# Patient Record
Sex: Male | Born: 1980 | Race: White | Hispanic: No | Marital: Single | Smoking: Current every day smoker
Health system: Southern US, Community
[De-identification: ages and names within clinical notes are randomized; demographics above are authoritative.]

## PROBLEM LIST (undated history)

## (undated) DIAGNOSIS — N289 Disorder of kidney and ureter, unspecified: Secondary | ICD-10-CM

## (undated) HISTORY — PX: ORIF ANKLE FRACTURE: SUR919

## (undated) HISTORY — PX: TOE AMPUTATION: SHX809

## (undated) HISTORY — PX: RENAL ARTERY STENT: SHX2321

## (undated) HISTORY — PX: APPENDECTOMY: SHX54

---

## 2014-08-06 ENCOUNTER — Emergency Department (HOSPITAL_COMMUNITY): Payer: Self-pay

## 2014-08-06 ENCOUNTER — Emergency Department (HOSPITAL_COMMUNITY)
Admission: EM | Admit: 2014-08-06 | Discharge: 2014-08-07 | Disposition: A | Discharge status: Home / Self Care | Payer: Self-pay | Attending: Emergency Medicine | Admitting: Emergency Medicine

## 2014-08-06 ENCOUNTER — Encounter (HOSPITAL_COMMUNITY): Payer: Self-pay | Admitting: Emergency Medicine

## 2014-08-06 ENCOUNTER — Emergency Department (HOSPITAL_COMMUNITY)
Admission: EM | Admit: 2014-08-06 | Discharge: 2014-08-06 | Disposition: A | Discharge status: Home / Self Care | Payer: Self-pay | Attending: Emergency Medicine | Admitting: Emergency Medicine

## 2014-08-06 DIAGNOSIS — R197 Diarrhea, unspecified: Secondary | ICD-10-CM | POA: Insufficient documentation

## 2014-08-06 DIAGNOSIS — Z720 Tobacco use: Secondary | ICD-10-CM | POA: Insufficient documentation

## 2014-08-06 DIAGNOSIS — Z960 Presence of urogenital implants: Secondary | ICD-10-CM | POA: Insufficient documentation

## 2014-08-06 DIAGNOSIS — Z87442 Personal history of urinary calculi: Secondary | ICD-10-CM | POA: Insufficient documentation

## 2014-08-06 DIAGNOSIS — R1110 Vomiting, unspecified: Secondary | ICD-10-CM | POA: Insufficient documentation

## 2014-08-06 DIAGNOSIS — R109 Unspecified abdominal pain: Secondary | ICD-10-CM

## 2014-08-06 DIAGNOSIS — N200 Calculus of kidney: Secondary | ICD-10-CM | POA: Insufficient documentation

## 2014-08-06 DIAGNOSIS — Z9889 Other specified postprocedural states: Secondary | ICD-10-CM | POA: Insufficient documentation

## 2014-08-06 DIAGNOSIS — N1330 Unspecified hydronephrosis: Secondary | ICD-10-CM | POA: Insufficient documentation

## 2014-08-06 HISTORY — DX: Disorder of kidney and ureter, unspecified: N28.9

## 2014-08-06 LAB — URINALYSIS, ROUTINE W REFLEX MICROSCOPIC
Glucose, UA: NEGATIVE mg/dL
Ketones, ur: >80 mg/dL — AB
NITRITE: NEGATIVE
PROTEIN: 100 mg/dL — AB
Specific Gravity, Urine: 1.026 (ref 1.005–1.030)
UROBILINOGEN UA: 1 mg/dL (ref 0.0–1.0)
pH: 5.5 (ref 5.0–8.0)

## 2014-08-06 LAB — COMPREHENSIVE METABOLIC PANEL
ALBUMIN: 4.7 g/dL (ref 3.5–5.2)
ALK PHOS: 72 U/L (ref 39–117)
ALT: 41 U/L (ref 0–53)
AST: 38 U/L — ABNORMAL HIGH (ref 0–37)
Anion gap: 15 (ref 5–15)
BUN: 12 mg/dL (ref 6–23)
CHLORIDE: 103 mmol/L (ref 96–112)
CO2: 21 mmol/L (ref 19–32)
Calcium: 9.8 mg/dL (ref 8.4–10.5)
Creatinine, Ser: 0.98 mg/dL (ref 0.50–1.35)
GFR calc Af Amer: >90 mL/min (ref >90)
GFR calc non Af Amer: >90 mL/min (ref >90)
Glucose, Bld: 101 mg/dL — ABNORMAL HIGH (ref 70–99)
POTASSIUM: 3.4 mmol/L — AB (ref 3.5–5.1)
Sodium: 139 mmol/L (ref 135–145)
Total Bilirubin: 1 mg/dL (ref 0.3–1.2)
Total Protein: 8.1 g/dL (ref 6.0–8.3)

## 2014-08-06 LAB — CBC WITH DIFFERENTIAL/PLATELET
BASOS ABS: 0 10*3/uL (ref 0.0–0.1)
Basophils Relative: 0 % (ref 0–1)
Eosinophils Absolute: 0.1 10*3/uL (ref 0.0–0.7)
Eosinophils Relative: 1 % (ref 0–5)
HCT: 47.7 % (ref 39.0–52.0)
Hemoglobin: 16.8 g/dL (ref 13.0–17.0)
Lymphocytes Relative: 14 % (ref 12–46)
Lymphs Abs: 1.9 10*3/uL (ref 0.7–4.0)
MCH: 31.9 pg (ref 26.0–34.0)
MCHC: 35.2 g/dL (ref 30.0–36.0)
MCV: 90.5 fL (ref 78.0–100.0)
Monocytes Absolute: 1.1 10*3/uL — ABNORMAL HIGH (ref 0.1–1.0)
Monocytes Relative: 8 % (ref 3–12)
NEUTROS PCT: 77 % (ref 43–77)
Neutro Abs: 11.2 10*3/uL — ABNORMAL HIGH (ref 1.7–7.7)
PLATELETS: 385 10*3/uL (ref 150–400)
RBC: 5.27 MIL/uL (ref 4.22–5.81)
RDW: 12.8 % (ref 11.5–15.5)
WBC: 14.4 10*3/uL — ABNORMAL HIGH (ref 4.0–10.5)

## 2014-08-06 LAB — URINE MICROSCOPIC-ADD ON

## 2014-08-06 LAB — LIPASE, BLOOD: LIPASE: 18 U/L (ref 11–59)

## 2014-08-06 MED ORDER — ONDANSETRON HCL 4 MG/2ML IJ SOLN
4.0000 mg | Freq: Once | INTRAMUSCULAR | Status: AC
  Administered 2014-08-06: 4 mg via INTRAVENOUS
  Filled 2014-08-06: qty 2

## 2014-08-06 MED ORDER — KETOROLAC TROMETHAMINE 30 MG/ML IJ SOLN
30.0000 mg | Freq: Once | INTRAMUSCULAR | Status: AC
  Administered 2014-08-07: 30 mg via INTRAVENOUS
  Filled 2014-08-06: qty 1

## 2014-08-06 MED ORDER — MORPHINE SULFATE 4 MG/ML IJ SOLN
6.0000 mg | Freq: Once | INTRAMUSCULAR | Status: AC
  Administered 2014-08-07: 6 mg via INTRAVENOUS
  Filled 2014-08-06: qty 2

## 2014-08-06 MED ORDER — KETOROLAC TROMETHAMINE 30 MG/ML IJ SOLN
30.0000 mg | Freq: Once | INTRAMUSCULAR | Status: AC
  Administered 2014-08-06: 30 mg via INTRAVENOUS
  Filled 2014-08-06: qty 1

## 2014-08-06 MED ORDER — OXYCODONE-ACETAMINOPHEN 5-325 MG PO TABS: 1.0000 | ORAL_TABLET | ORAL | Status: AC | PRN

## 2014-08-06 MED ORDER — MORPHINE SULFATE 4 MG/ML IJ SOLN
4.0000 mg | Freq: Once | INTRAMUSCULAR | Status: AC
  Administered 2014-08-06: 4 mg via INTRAVENOUS
  Filled 2014-08-06: qty 1

## 2014-08-06 MED ORDER — HYDROMORPHONE HCL 1 MG/ML IJ SOLN
1.0000 mg | Freq: Once | INTRAMUSCULAR | Status: AC
  Administered 2014-08-06: 1 mg via INTRAVENOUS
  Filled 2014-08-06: qty 1

## 2014-08-06 MED ORDER — SODIUM CHLORIDE 0.9 % IV BOLUS (SEPSIS)
1000.0000 mL | Freq: Once | INTRAVENOUS | Status: AC
  Administered 2014-08-06: 1000 mL via INTRAVENOUS

## 2014-08-06 MED ORDER — SODIUM CHLORIDE 0.9 % IV BOLUS (SEPSIS)
1000.0000 mL | Freq: Once | INTRAVENOUS | Status: AC
  Administered 2014-08-07: 1000 mL via INTRAVENOUS

## 2014-08-06 MED ORDER — PROMETHAZINE HCL 25 MG PO TABS: 25.0000 mg | ORAL_TABLET | Freq: Four times a day (QID) | ORAL | Status: AC | PRN

## 2014-08-06 NOTE — ED Notes (Signed)
Patient states he was here this morning, CT showed a kidney stone. Patient was having a difficult time voiding then, states he still has not voided in 10 hours. C/o Pressure.

## 2014-08-06 NOTE — ED Notes (Signed)
Pt is attempting to give urine sample at this time 

## 2014-08-06 NOTE — ED Notes (Signed)
Pt reports L flank pain with emesis that started around 7 am this morning. Pt sts that he last urinated around and has been unable to since. Pt has hx of kidney stones with bilateral stents.  Pt is A&O and in NAD.

## 2014-08-06 NOTE — ED Provider Notes (Signed)
TIME SEEN: 9:12 AM  CHIEF COMPLAINT: Left flank pain, vomiting, diarrhea  HPI: Pt is a 34 y.o. male with reported history of kidney stones that was diagnosed in [place], [place] requiring left-sided ureteral stent placed one year ago who presents emergency room with left flank pain that started at 7:00 this morning with vomiting and diarrhea. He is unsure if this feels similar to his prior kidney stones. Denies dysuria, hematuria, penile discharge, testicular pain or swelling. No fever. No sick contacts. States he works at [employer] but denies any injury to his back. No numbness, tingling or focal weakness. No aggravating or relieving factors. Pain does not worsen with movement or palpation. No radiation of pain. Described as severe, sharp.  ROS: See HPI Constitutional: no fever  Eyes: no drainage  ENT: no runny nose   Cardiovascular:  no chest pain  Resp: no SOB  GI: no vomiting GU: no dysuria Integumentary: no rash  Allergy: no hives  Musculoskeletal: no leg swelling  Neurological: no slurred speech ROS otherwise negative  PAST MEDICAL HISTORY/PAST SURGICAL HISTORY:  Past Medical History  Diagnosis Date  . Renal disorder     kidney stones    MEDICATIONS:  Prior to Admission medications   Not on File    ALLERGIES:  No Known Allergies  SOCIAL HISTORY:  History  Substance Use Topics  . Smoking status: Current Every Day Smoker -- 0.50 packs/day    Types: Cigarettes  . Smokeless tobacco: Not on file  . Alcohol Use: Yes     Comment: socially    FAMILY HISTORY: No family history on file.  EXAM: BP 112/69 mmHg  Pulse 105  Temp(Src) 98.1 F (36.7 C) (Oral)  Resp 18  SpO2 99% CONSTITUTIONAL: Alert and oriented and responds appropriately to questions. Well-appearing; well-nourished, appears uncomfortable but nontoxic HEAD: Normocephalic EYES: Conjunctivae clear, PERRL ENT: normal nose; no rhinorrhea; moist mucous membranes; pharynx without lesions noted, extremely poor  dentition without obvious sign of abscess NECK: Supple, no meningismus, no LAD  CARD: regular and tachycardic; S1 and S2 appreciated; no murmurs, no clicks, no rubs, no gallops RESP: Normal chest excursion without splinting or tachypnea; breath sounds clear and equal bilaterally; no wheezes, no rhonchi, no rales,  ABD/GI: Normal bowel sounds; non-distended; soft, non-tender, no rebound, no guarding BACK:  The back appears normal and is non-tender to palpation, there is no CVA tenderness, no midline spinal tenderness or step-off or deformity EXT: Normal ROM in all joints; non-tender to palpation; no edema; normal capillary refill; no cyanosis    SKIN: Normal color for age and race; warm NEURO: Moves all extremities equally, sensation to light touch intact diffusely, normal gait PSYCH: The patient's mood and manner are appropriate. Grooming and personal hygiene are appropriate.  MEDICAL DECISION MAKING: Patient here with sudden onset left flank pain, vomiting or diarrhea. Suspect probable kidney stone. Patient reports he has had to have bilateral ureteral stents in the past. We'll obtain labs, urine, CT abdomen and pelvis. We'll give IV fluids, Toradol, morphine and Zofran.  ED PROGRESS: Patient reports feeling better after pain medication. Labs show mild leukocytosis with left shift. Urine shows hemoglobin, leukocytes, ketones, bacteria and squamous cells. We'll continue IV hydration. CT scan shows moderate left-sided hydronephrosis with an indwelling left-sided ureteral stent with good positioning but hydronephrosis may reflect relative stent malfunction/occlusion. There is a calculus in the lower pole of the left kidney. He states he does not plan on going back to [place] and now lives here in  [place] and does not have a urologist for follow-up. He thought that he had already had the stent removed. States that he was told that he should have passed the stent. Will consult urology on call.   12:00 PM   D/w Dr. Vernie Ammonsttelin, urology on call.  He recommends sending a urine culture. We both agree the patient does not need to be started on abx at this time. Patient is feeling much better. We'll discharge on pain medication and with nausea medicine and have him follow-up with urology. He will need the stent removed non-emergently. Discussed return precautions. Patient verbalized understanding and is comfortable with plan.  Layla MawKristen N Ward, DO 08/06/14 1243

## 2014-08-06 NOTE — Discharge Instructions (Signed)
You have a stent in your left ureter which is the tube that connects the kidney to the bladder but is still in place and needs to be removed and is likely the cause of your left-sided back pain. This does not need to be removed emergently. We recommended she follow-up with the urologist. Please call to schedule an appointment. If you develop fevers over 101, vomiting and cannot stop, uncontrolled pain, please return to the emergency department.   Hydronephrosis Hydronephrosis is an abnormal enlargement of your kidney. It can affect one or both the kidneys. It results from the backward pressure of urine on the kidneys, when the flow of urine is blocked. Normally, the urine drains from the kidney through the urine tube (ureter), into a sac which holds the urine until urination (bladder). When the urinary flow is blocked, the urine collects above the block. This causes an increase in the pressure inside the kidney, which in turn leads to its enlargement. The block can occur at the point where the kidney joins the ureter. Treatment depends on the cause and location of the block.  CAUSES  The causes of this condition include:  Birth defect of the kidney or ureter.  Kink at the point where the kidney joins the ureter.  Stones and blood clots in the kidney or ureter.  Cancer, injury, or infection of the ureter.  Scar tissue formation.  Backflow of urine (reflux).  Cancer of bladder or prostate gland.  Abnormality of the nerves or muscles of the kidney or ureter.  Lower part of the ureter protruding into the bladder (ureterocele).  Abnormal contractions of the bladder.  Both the kidneys can be affected during pregnancy. This is because the enlarging uterus presses on the ureters and blocks the flow of urine. SYMPTOMS  The symptoms depend on the location of the block. They also depend on how long the block has been present. You may feel pain on the affected side. Sometimes, you may not have any  symptoms. There may be a dull ache or discomfort in the flank. The common symptoms are:  Flank pain.  Swelling of the abdomen.  Pain in the abdomen.  Nausea and vomiting.  Fever.  Pain while passing urine.  Urgency for urination.  Frequent or urgent urination.  Infection of the urinary tract. DIAGNOSIS  Your caregiver will examine you after asking about your symptoms. You may be asked to do blood and urine tests. Your caregiver may order a special X-ray, ultrasound, or CT scan. Sometimes a rigid or flexible telescope (cystoscope) is used to view the site of the blockage.  TREATMENT  Treatment depends on the site, cause, and duration of the block. The goal of treatment is to remove the blockage. Your caregiver will plan the treatment based on your condition. The different types of treatment are:   Putting in a soft plastic tube (ureteral stent) to connect the bladder with the kidney. This will help in draining the urine.  Putting in a soft tube (nephrostomy tube). This is placed through skin into the kidney. The trapped urine is drained out through the back. A plastic bag is attached to your skin to hold the urine that has drained out.  Antibiotics to treat or prevent infection.  Breaking down of the stone (lithotripsy). HOME CARE INSTRUCTIONS   It may take some time for the hydronephrosis to go away (resolve). Drink fluids as directed by your caregiver , and get a lot of rest.  If you have a  drain in, your caregiver will give you directions about how to care for it. Be sure you understand these directions completely before you go home.  Take any antibiotics, pain medications, or other prescriptions exactly as prescribed.  Follow-up with your caregivers as directed. SEEK MEDICAL CARE IF:   You continue to have flank pain, nausea, or difficulty with urination.  You have any problem with any type of drainage device.  Your urine becomes cloudy or bloody. SEEK IMMEDIATE  MEDICAL CARE IF:   You have severe flank and/or abdominal pain.  You develop vomiting and are unable to hold down fluids.  You develop a fever above 100.5 F (38.1 C), or as per your caregiver. MAKE SURE YOU:   Understand these instructions.  Will watch your condition.  Will get help right away if you are not doing well or get worse. Document Released: 03/17/2007 Document Revised: 08/12/2011 Document Reviewed: 05/03/2010 [facility] Patient Information 2015 [employer], [place]. This information is not intended to replace advice given to you by your health care provider. Make sure you discuss any questions you have with your health care provider.

## 2014-08-06 NOTE — ED Notes (Signed)
Pt from home via EMS-Per EMS pt had sudden onset L flank pain with emesis. Pt has hx of kidney stones. Pt denies dysuria but reports dark urine. Pt is A&O and in NAD. Pt ambulates with steady gait

## 2014-08-06 NOTE — ED Notes (Signed)
Patient transported to CT 

## 2014-08-06 NOTE — ED Notes (Signed)
Bed: WA17 Expected date: 08/06/14 Expected time: 9:04 AM Means of arrival: Ambulance Comments: ? Kidney stones L flank pain

## 2014-08-06 NOTE — ED Notes (Signed)
MD at bedside. 

## 2014-08-07 ENCOUNTER — Emergency Department (HOSPITAL_COMMUNITY): Payer: Self-pay

## 2014-08-07 LAB — I-STAT CHEM 8, ED
BUN: 11 mg/dL (ref 6–23)
CALCIUM ION: 1.12 mmol/L (ref 1.12–1.23)
CHLORIDE: 101 mmol/L (ref 96–112)
Creatinine, Ser: 0.9 mg/dL (ref 0.50–1.35)
Glucose, Bld: 81 mg/dL (ref 70–99)
HCT: 48 % (ref 39.0–52.0)
Hemoglobin: 16.3 g/dL (ref 13.0–17.0)
Potassium: 3.5 mmol/L (ref 3.5–5.1)
Sodium: 138 mmol/L (ref 135–145)
TCO2: 20 mmol/L (ref 0–100)

## 2014-08-07 LAB — CBC WITH DIFFERENTIAL/PLATELET
BASOS ABS: 0.1 10*3/uL (ref 0.0–0.1)
Basophils Relative: 0 % (ref 0–1)
Eosinophils Absolute: 0.4 10*3/uL (ref 0.0–0.7)
Eosinophils Relative: 3 % (ref 0–5)
HEMATOCRIT: 43.6 % (ref 39.0–52.0)
Hemoglobin: 15 g/dL (ref 13.0–17.0)
Lymphocytes Relative: 21 % (ref 12–46)
Lymphs Abs: 2.5 10*3/uL (ref 0.7–4.0)
MCH: 31.8 pg (ref 26.0–34.0)
MCHC: 34.4 g/dL (ref 30.0–36.0)
MCV: 92.6 fL (ref 78.0–100.0)
Monocytes Absolute: 1.4 10*3/uL — ABNORMAL HIGH (ref 0.1–1.0)
Monocytes Relative: 12 % (ref 3–12)
NEUTROS PCT: 64 % (ref 43–77)
Neutro Abs: 7.5 10*3/uL (ref 1.7–7.7)
Platelets: 346 10*3/uL (ref 150–400)
RBC: 4.71 MIL/uL (ref 4.22–5.81)
RDW: 13 % (ref 11.5–15.5)
WBC: 11.8 10*3/uL — ABNORMAL HIGH (ref 4.0–10.5)

## 2014-08-07 LAB — URINALYSIS, ROUTINE W REFLEX MICROSCOPIC
GLUCOSE, UA: NEGATIVE mg/dL
Ketones, ur: >80 mg/dL — AB
Nitrite: NEGATIVE
PH: 6 (ref 5.0–8.0)
Protein, ur: 30 mg/dL — AB
SPECIFIC GRAVITY, URINE: 1.024 (ref 1.005–1.030)
Urobilinogen, UA: 1 mg/dL (ref 0.0–1.0)

## 2014-08-07 LAB — URINE CULTURE: Colony Count: 50000

## 2014-08-07 LAB — URINE MICROSCOPIC-ADD ON

## 2014-08-07 MED ORDER — MORPHINE SULFATE 4 MG/ML IJ SOLN: 4.0000 mg | Freq: Once | INTRAMUSCULAR | Status: DC

## 2014-08-07 MED ORDER — SODIUM CHLORIDE 0.9 % IV BOLUS (SEPSIS)
1000.0000 mL | Freq: Once | INTRAVENOUS | Status: AC
  Administered 2014-08-07: 1000 mL via INTRAVENOUS

## 2014-08-07 MED ORDER — CEPHALEXIN 500 MG PO CAPS: 500.0000 mg | ORAL_CAPSULE | Freq: Two times a day (BID) | ORAL | Status: AC

## 2014-08-07 MED ORDER — CEPHALEXIN 500 MG PO CAPS
500.0000 mg | ORAL_CAPSULE | Freq: Once | ORAL | Status: AC
  Administered 2014-08-07: 500 mg via ORAL
  Filled 2014-08-07: qty 1

## 2014-08-07 MED ORDER — MORPHINE SULFATE 4 MG/ML IJ SOLN
6.0000 mg | Freq: Once | INTRAMUSCULAR | Status: AC
Start: 2014-08-07 — End: 2014-08-07
  Administered 2014-08-07: 6 mg via INTRAVENOUS
  Filled 2014-08-07: qty 2

## 2014-08-07 NOTE — Discharge Instructions (Signed)
Kidney Stones Mr. Izola PriceMyers, your urine shows a mild infection, take antibiotics as prescribed. Follow-up with urology within 3 days for continued management. If symptoms worsen come back to the emergency department immediately. Thank you. Kidney stones (urolithiasis) are solid masses that form inside your kidneys. The intense pain is caused by the stone moving through the kidney, ureter, bladder, and urethra (urinary tract). When the stone moves, the ureter starts to spasm around the stone. The stone is usually passed in your pee (urine).  HOME CARE  Drink enough fluids to keep your pee clear or pale yellow. This helps to get the stone out.  Strain all pee through the provided strainer. Do not pee without peeing through the strainer, not even once. If you pee the stone out, catch it in the strainer. The stone may be as small as a grain of salt. Take this to your doctor. This will help your doctor figure out what you can do to try to prevent more kidney stones.  Only take medicine as told by your doctor.  Follow up with your doctor as told.  Get follow-up X-rays as told by your doctor. GET HELP IF: You have pain that gets worse even if you have been taking pain medicine. GET HELP RIGHT AWAY IF:   Your pain does not get better with medicine.  You have a fever or shaking chills.  Your pain increases and gets worse over 18 hours.  You have new belly (abdominal) pain.  You feel faint or pass out.  You are unable to pee. MAKE SURE YOU:   Understand these instructions.  Will watch your condition.  Will get help right away if you are not doing well or get worse. Document Released: 11/06/2007 Document Revised: 01/20/2013 Document Reviewed: 10/21/2012 [facility] Patient Information 2015 [place], [place]. This information is not intended to replace advice given to you by your health care provider. Make sure you discuss any questions you have with your health care provider.

## 2014-08-07 NOTE — ED Notes (Signed)
Pt has been in lobby, possibly since discharge this morning. Pt given bus pass and sandwich, given instructions on how to [facility].

## 2014-08-07 NOTE — ED Notes (Signed)
Pt is unable to urinate at this time. 

## 2014-08-07 NOTE — ED Notes (Signed)
Obtained of dk concentrated urine.

## 2014-08-07 NOTE — ED Notes (Signed)
Pt is still unable to give a urine specimen he has tried but was not successful.

## 2014-08-07 NOTE — ED Provider Notes (Signed)
CSN: [id number]     Arrival date & time 08/06/14  2142 History   First MD Initiated Contact with Patient 08/06/14 2349     Chief Complaint  Patient presents with  . unable to void      (Consider location/radiation/quality/duration/timing/severity/associated sxs/prior Treatment) HPI    Karlyn AgeeRichard Nole is a 34 y.o. male with past medical history of nephrolithiasis and stent placement presenting today with left lower quadrant abdominal pain. Patient was seen earlier today and discharged home with pain medication. CT scan that showed possible kinking of the stent. He was told to come back if he cannot urinate throughout the day. Patient states he has not been able to urinate today at all. His pain has gotten worse throughout the day as well. Patient did not fill his prescription. He denies any fevers currently.  He states he was not even aware that his stent was still in, he thought it had been removed.  10 Systems reviewed and are negative for acute change except as noted in the HPI.   Past Medical History  Diagnosis Date  . Renal disorder     kidney stones   Past Surgical History  Procedure Laterality Date  . Renal artery stent Bilateral   . Toe amputation Right reattached  . Orif ankle fracture Right   . Appendectomy     No family history on file. History  Substance Use Topics  . Smoking status: Current Every Day Smoker -- 0.50 packs/day    Types: Cigarettes  . Smokeless tobacco: Not on file  . Alcohol Use: Yes     Comment: socially    Review of Systems    Allergies  Review of patient's allergies indicates no known allergies.  Home Medications   Prior to Admission medications   Medication Sig Start Date End Date Taking? Authorizing Provider  oxyCODONE-acetaminophen (PERCOCET/ROXICET) 5-325 MG per tablet Take 1 tablet by mouth every 4 (four) hours as needed. 08/06/14   Kristen N Ward, DO  promethazine (PHENERGAN) 25 MG tablet Take 1 tablet (25 mg total) by mouth every 6  (six) hours as needed for nausea or vomiting. 08/06/14   Kristen N Ward, DO   BP 136/85 mmHg  Pulse 90  Temp(Src) 98.2 F (36.8 C) (Oral)  Resp 16  Ht 6\' 2"  (1.88 m)  Wt 210 lb (95.255 kg)  BMI 26.95 kg/m2  SpO2 98% Physical Exam  Constitutional: He is oriented to person, place, and time. Vital signs are normal. He appears well-developed and well-nourished.  Non-toxic appearance. He does not appear ill. No distress.  HENT:  Head: Normocephalic and atraumatic.  Nose: Nose normal.  Mouth/Throat: Oropharynx is clear and moist. No oropharyngeal exudate.  Eyes: Conjunctivae and EOM are normal. Pupils are equal, round, and reactive to light. No scleral icterus.  Neck: Normal range of motion. Neck supple. No tracheal deviation, no edema, no erythema and normal range of motion present. No thyroid mass and no thyromegaly present.  Cardiovascular: Normal rate, regular rhythm, S1 normal, S2 normal, normal heart sounds, intact distal pulses and normal pulses.  Exam reveals no gallop and no friction rub.   No murmur heard. Pulses:      Radial pulses are 2+ on the right side, and 2+ on the left side.       Dorsalis pedis pulses are 2+ on the right side, and 2+ on the left side.  Pulmonary/Chest: Effort normal and breath sounds normal. No respiratory distress. He has no wheezes. He has no rhonchi.  He has no rales.  Abdominal: Soft. Normal appearance and bowel sounds are normal. He exhibits no distension, no ascites and no mass. There is no hepatosplenomegaly. There is no tenderness. There is no rebound, no guarding and no CVA tenderness.  No CVA tenderness.  Musculoskeletal: Normal range of motion. He exhibits no edema or tenderness.  Lymphadenopathy:    He has no cervical adenopathy.  Neurological: He is alert and oriented to person, place, and time. He has normal strength. No cranial nerve deficit or sensory deficit.  Skin: Skin is warm, dry and intact. No petechiae and no rash noted. He is not  diaphoretic. No erythema. No pallor.  Psychiatric: He has a normal mood and affect. His behavior is normal. Judgment normal.  Nursing note and vitals reviewed.   ED Course  Procedures (including critical care time) Labs Review Labs Reviewed  CBC WITH DIFFERENTIAL/PLATELET - Abnormal; Notable for the following:    WBC 11.8 (*)    Monocytes Absolute 1.4 (*)    All other components within normal limits  URINALYSIS, ROUTINE W REFLEX MICROSCOPIC - Abnormal; Notable for the following:    Color, Urine AMBER (*)    APPearance CLOUDY (*)    Hgb urine dipstick LARGE (*)    Bilirubin Urine MODERATE (*)    Ketones, ur >80 (*)    Protein, ur 30 (*)    Leukocytes, UA SMALL (*)    All other components within normal limits  URINE MICROSCOPIC-ADD ON - Abnormal; Notable for the following:    Bacteria, UA FEW (*)    All other components within normal limits  I-STAT CHEM 8, ED    Imaging Review US Renal  08/07/2014   CLINICAL DATA:  Assess for worsening hydronephrosis  EXAM: RENAL/URINARY TRACT ULTRASOUND COMPLETE  COMPARISON:  Abdominal CT from yesterday  FINDINGS: Right Kidney:  Length: 11.6 cm. Echogenicity within normal limits. No mass or hydronephrosis visualized.  Left Kidney:  Length: 12.5 cm. There is unchanged moderate hydronephrosis. An 11 mm (by CT) stone is noted in the lower pole. A portion of the stent is visible at the level of the renal pelvis. No mass or cystic collection.  Bladder:  Unremarkable bladder wall. Ureteral stent from the left with fully formed lower loop.  IMPRESSION: 1. Unchanged moderate left hydronephrosis. 2. No appreciable displacement of a left ureteral stent. 3. Left nephrolithiasis.   Electronically Signed   By: Marnee Spring M.D.   On: 08/07/2014 01:43   Ct Renal Stone Study  08/06/2014   CLINICAL DATA:  Left flank pain and history of left ureteral stent placement and renal calculi.  EXAM: CT ABDOMEN AND PELVIS WITHOUT CONTRAST  TECHNIQUE: Multidetector CT imaging  of the abdomen and pelvis was performed following the standard protocol without IV contrast.  COMPARISON:  None.  FINDINGS: There is moderate left-sided hydronephrosis. A ureteral stent is present extending from the renal pelvis into the bladder. The presence of hydronephrosis may implicate relative stent malfunction/ occlusion. A calculus is present in the anterior lower pole collecting system of the left kidney measuring approximately 7 x 11 x 11 mm. This is not causing obstruction of the lower pole. No other renal or ureteral calculi identified bilaterally. The right kidney and ureter appear normal. No bladder calculi.  The rest of the unenhanced appearance of the abdomen and pelvis is unremarkable including the liver, gallbladder, pancreas, spleen, adrenal glands and bowel. No abnormal fluid collections are visualized. No masses or enlarged lymph nodes are seen. Bowel  shows no evidence of obstruction or ileus. No free air, free fluid or abscess is identified. No evidence of hernia.  Bony structures are unremarkable. Prominent inferior endplate Schmorl's node of the L3 vertebral body. Visualized lung bases are normal.  IMPRESSION: Moderate left-sided hydronephrosis. Indwelling left-sided ureteral stent shows good positioning and the presence of hydronephrosis may reflect relative stent malfunction/ occlusion. Solitary calculus in the lower pole of the left kidney measures approximately 11 mm in greatest diameter.   Electronically Signed   By: Irish Lack M.D.   On: 08/06/2014 10:49     EKG Interpretation None      MDM   Final diagnoses:  None    Patient since emergency department for worsening abdominal pain and inability to urinate, and the setting of recent nephrolithiasis diagnosis. Initial CT scan earlier today shows kinking of his stent. Will obtain bladder scan, repeat creatinine, repeat urinalysis and renal ultrasound. If hydronephrosis is worse, patient warrants admission to urology for  continued management.   Repeat ultrasound reveals stable hydronephrosis. Creatinine is stable as well. Urine is borderline for infection. Since the patient is a bounce back we'll treat with antibiotics. He was given urology follow-up, also has pain prescription from earlier today. At this time the patient's vital signs remain within normal limits, he appears in no acute distress and he is safe for discharge.   Tomasita Crumble, MD 08/07/14 [phone]

## 2014-08-07 NOTE — ED Notes (Signed)
Requested patient to urinate. Urinal was given to patient.

## 2014-08-08 ENCOUNTER — Ambulatory Visit: Payer: Self-pay | Admitting: Urology

## 2016-07-30 IMAGING — CT CT RENAL STONE PROTOCOL
1 series · 15 of 26 positions shown, 19 images · non-contrast
Comparison: None.

CLINICAL DATA: Left flank pain and history of left ureteral stent
placement and renal calculi.

EXAM:
CT ABDOMEN AND PELVIS WITHOUT CONTRAST
TECHNIQUE: Multidetector CT imaging of the abdomen and pelvis was performed
following the standard protocol without IV contrast.

[Series 4: lung · axial · 0.74mm/px · z∈[-180,-65]mm · 15 of 26 slices shown, 19 images]
[im 2/26  soft-tissue]
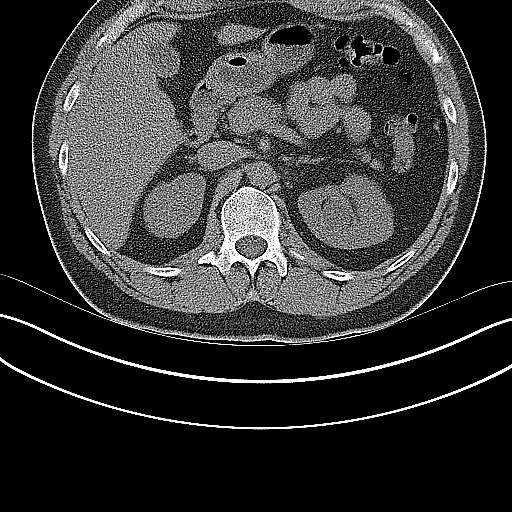
[im 2/26  bone]
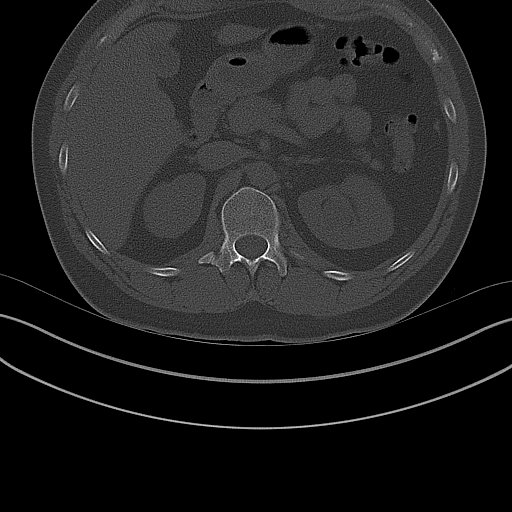
[im 4/26  soft-tissue]
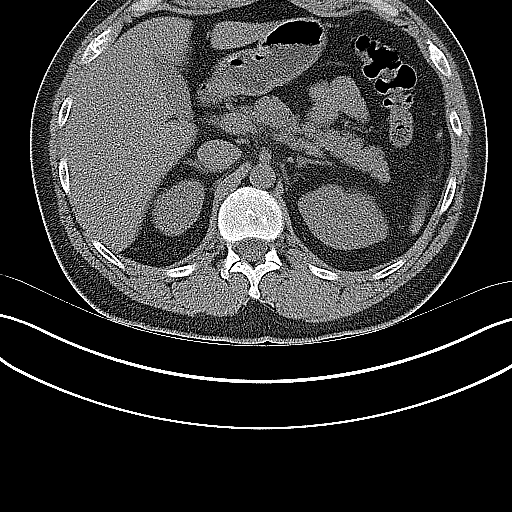
[im 6/26  soft-tissue]
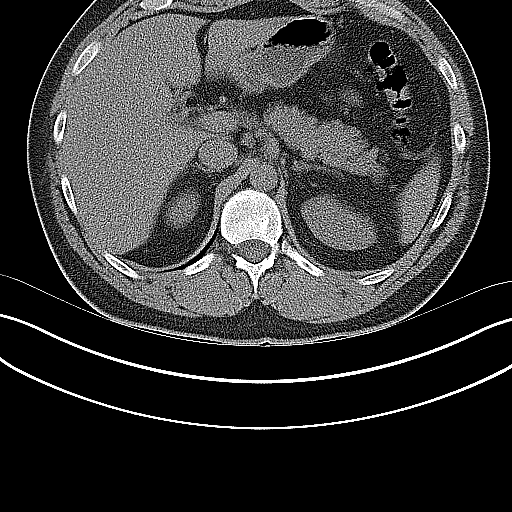
[im 8/26  soft-tissue]
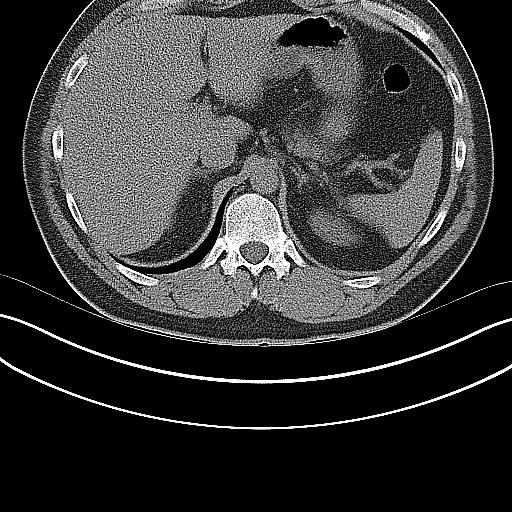
[im 10/26  soft-tissue]
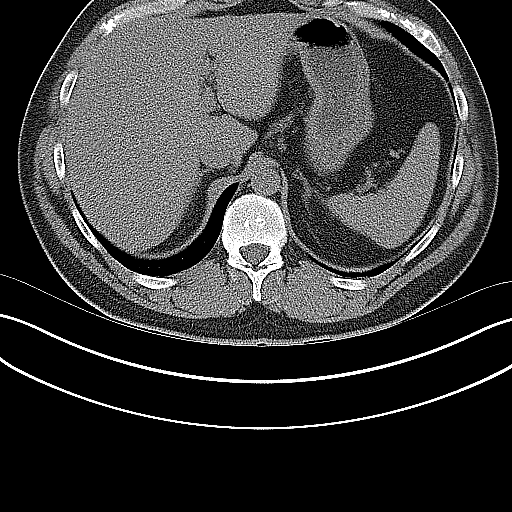
[im 12/26  soft-tissue]
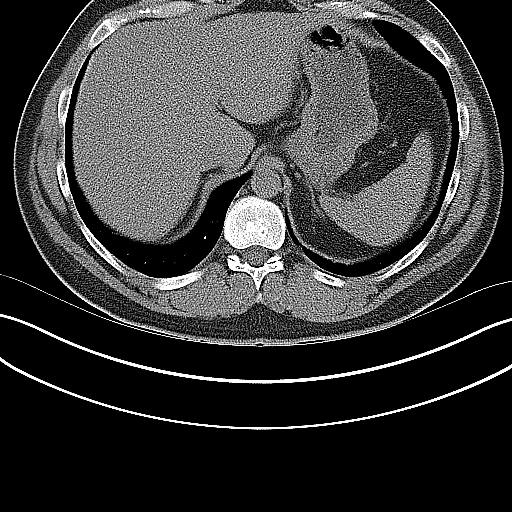
[im 14/26  soft-tissue]
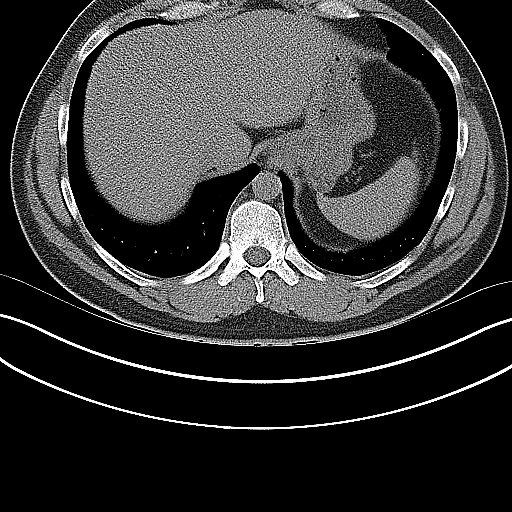
[im 15/26  soft-tissue]
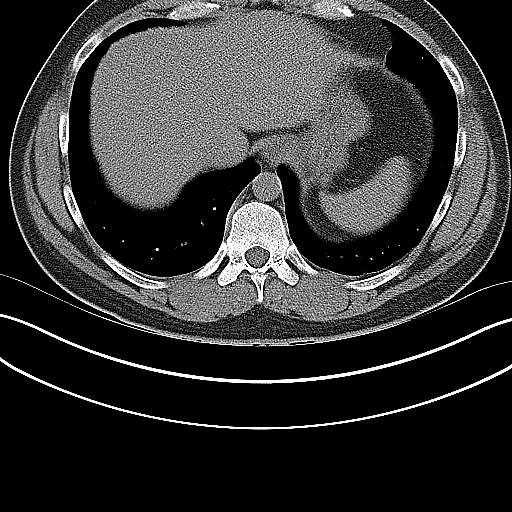
[im 17/26  soft-tissue]
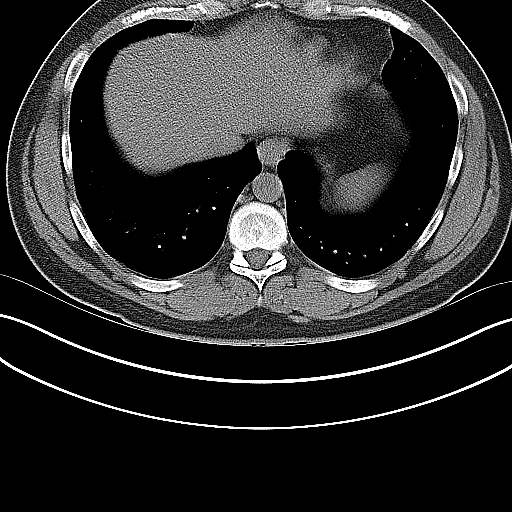
[im 17/26  bone]
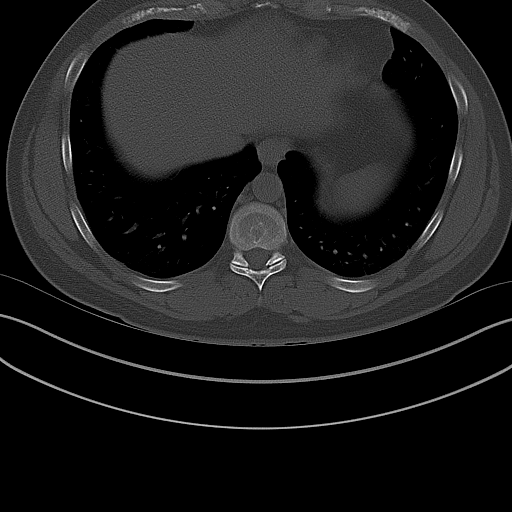
[im 19/26  soft-tissue]
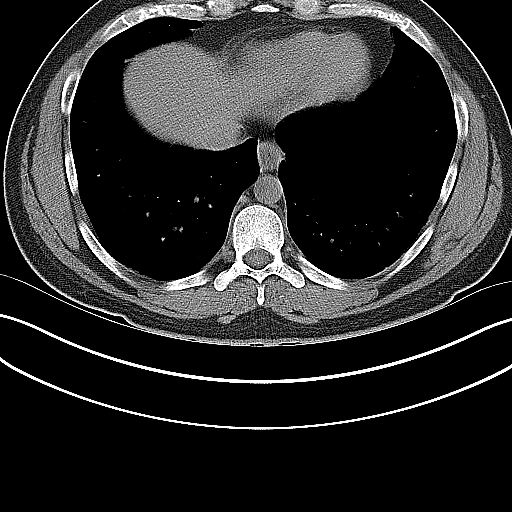
[im 21/26  soft-tissue]
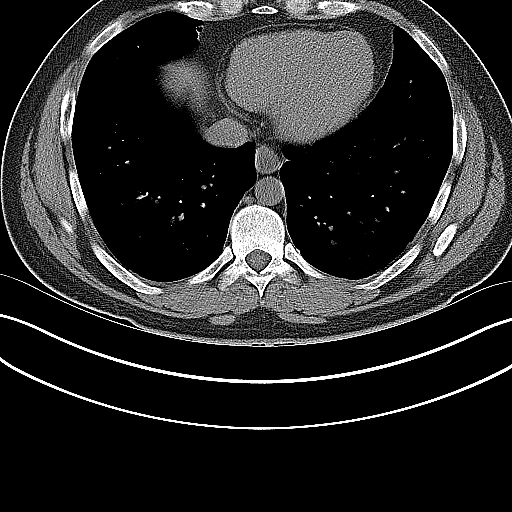
[im 22/26  lung]
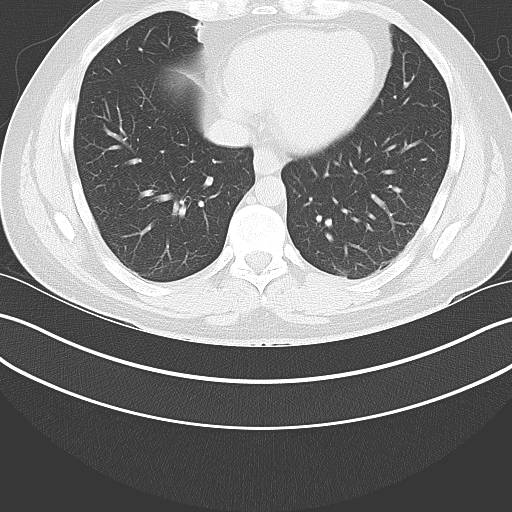
[im 23/26  soft-tissue]
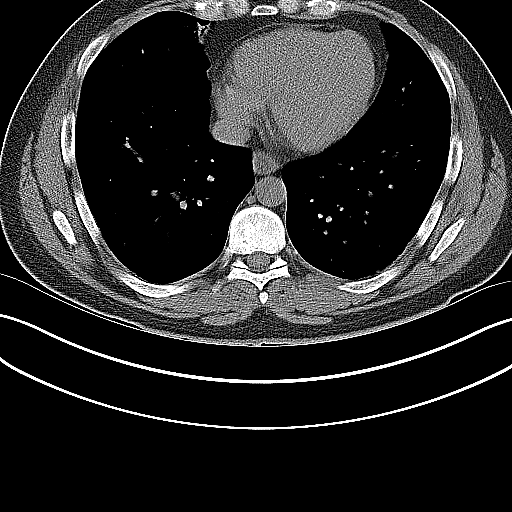
[im 23/26  lung]
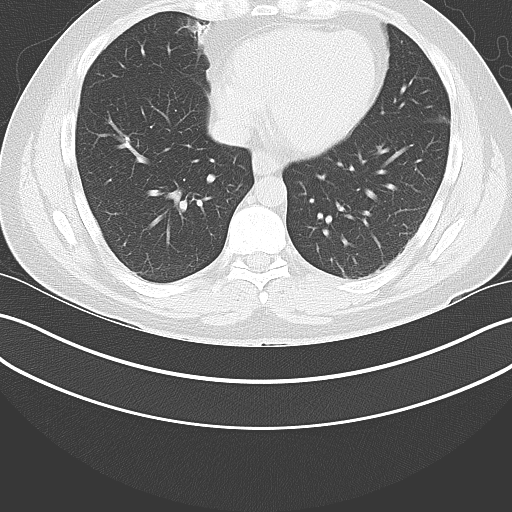
[im 24/26  lung]
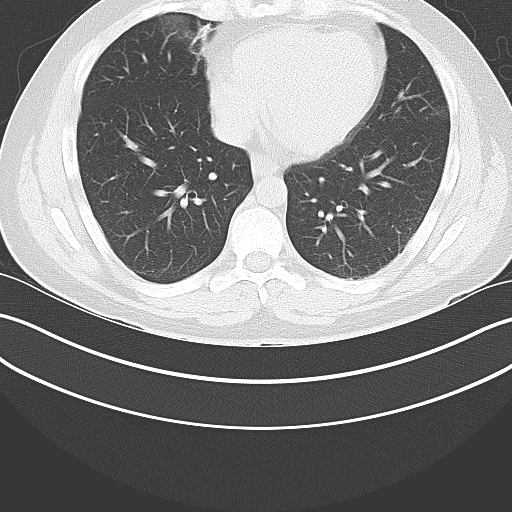
[im 25/26  soft-tissue]
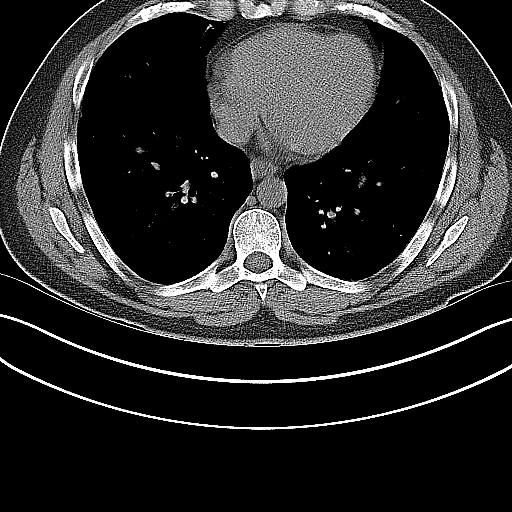
[im 25/26  lung]
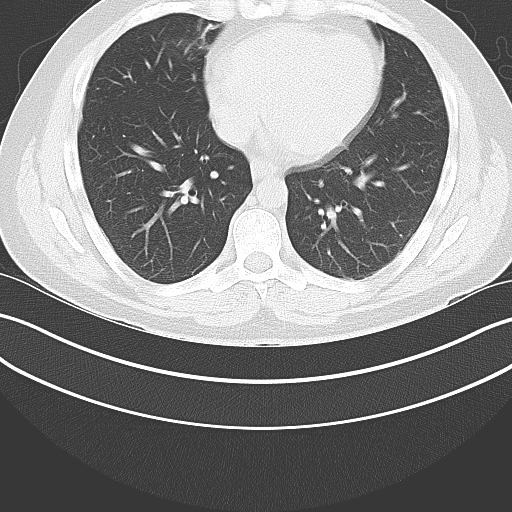

[15 of 26 positions shown; findings below may reference images not displayed]

FINDINGS: There is moderate left-sided hydronephrosis. A ureteral stent is
present extending from the renal pelvis into the bladder. The
presence of hydronephrosis may implicate relative stent malfunction/
occlusion. A calculus is present in the anterior lower pole
collecting system of the left kidney measuring approximately 7 x 11
x 11 mm. This is not causing obstruction of the lower pole. No other
renal or ureteral calculi identified bilaterally. The right kidney
and ureter appear normal. No bladder calculi.

The rest of the unenhanced appearance of the abdomen and pelvis is
unremarkable including the liver, gallbladder, pancreas, spleen,
adrenal glands and bowel. No abnormal fluid collections are
visualized. No masses or enlarged lymph nodes are seen. Bowel shows
no evidence of obstruction or ileus. No free air, free fluid or
abscess is identified. No evidence of hernia.

Bony structures are unremarkable. Prominent inferior endplate
Schmorl's node of the L3 vertebral body. Visualized lung bases are
normal.
IMPRESSION: Moderate left-sided hydronephrosis. Indwelling left-sided ureteral
stent shows good positioning and the presence of hydronephrosis may
reflect relative stent malfunction/ occlusion. Solitary calculus in
the lower pole of the left kidney measures approximately 11 mm in
greatest diameter.

## 2016-07-31 IMAGING — US US RENAL
1 series · 14 of 25 positions shown · non-contrast
Comparison: Abdominal CT from yesterday

CLINICAL DATA: Assess for worsening hydronephrosis

EXAM:
RENAL/URINARY TRACT ULTRASOUND COMPLETE

[Series 1: us renal · 0.21mm/px · 14 of 27 slices shown]
[im 1/27]
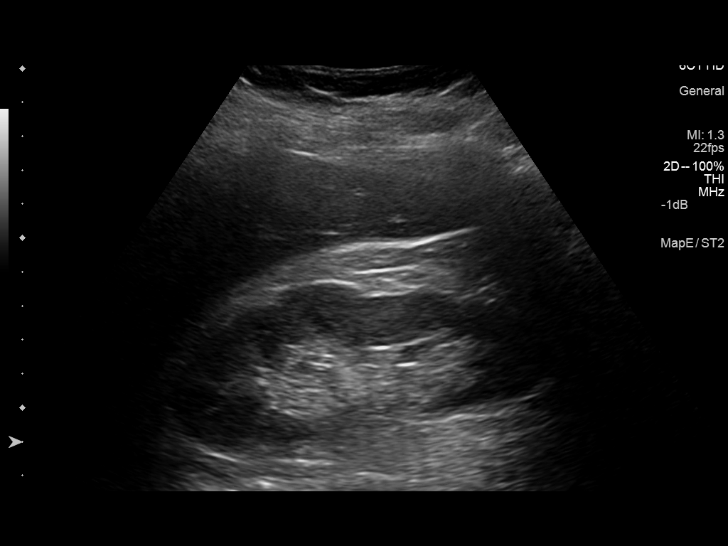
[im 3/27]
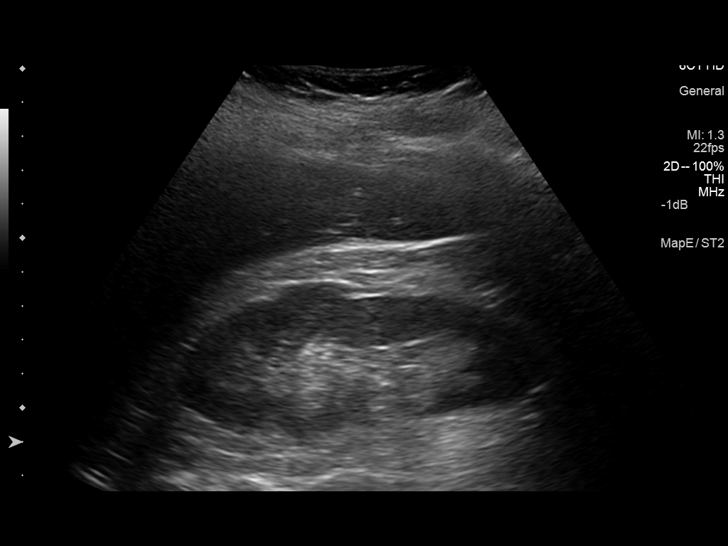
[im 5/27]
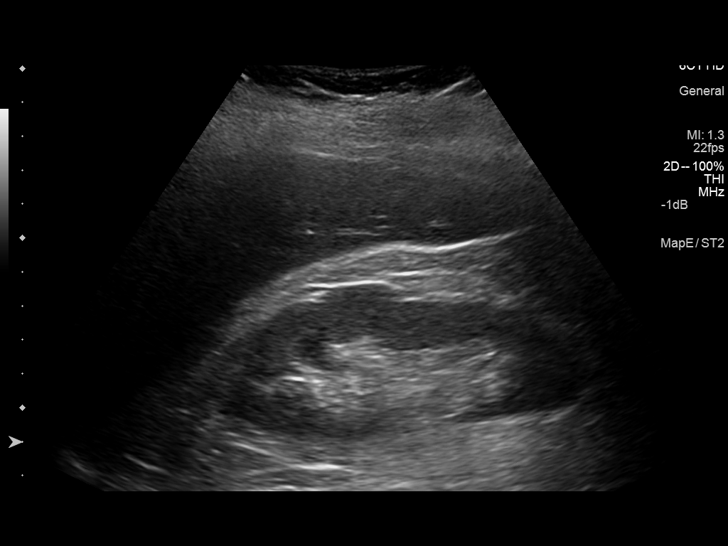
[im 7/27]
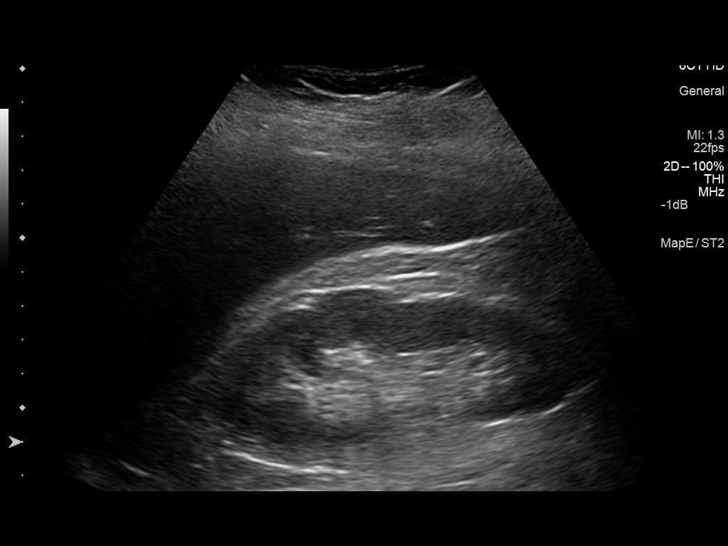
[im 9/27]
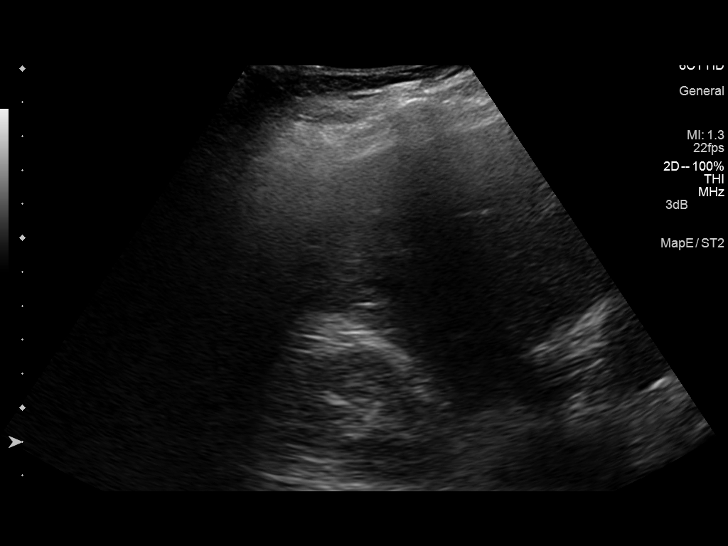
[im 10/27]
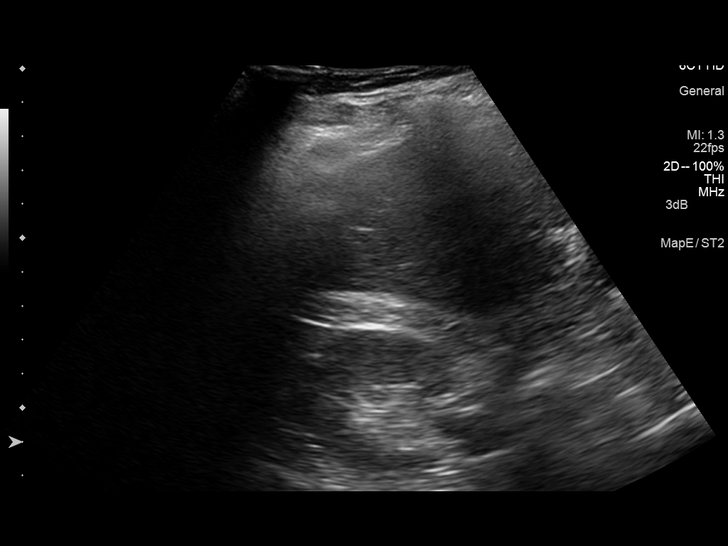
[im 12/27]
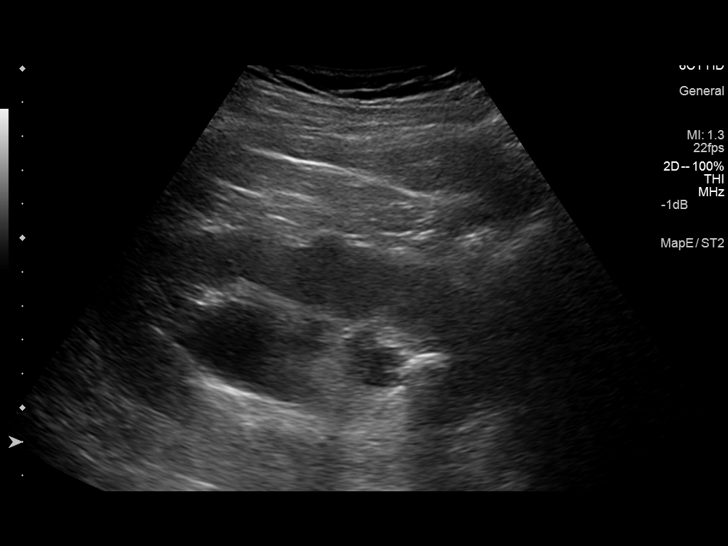
[im 15/27]
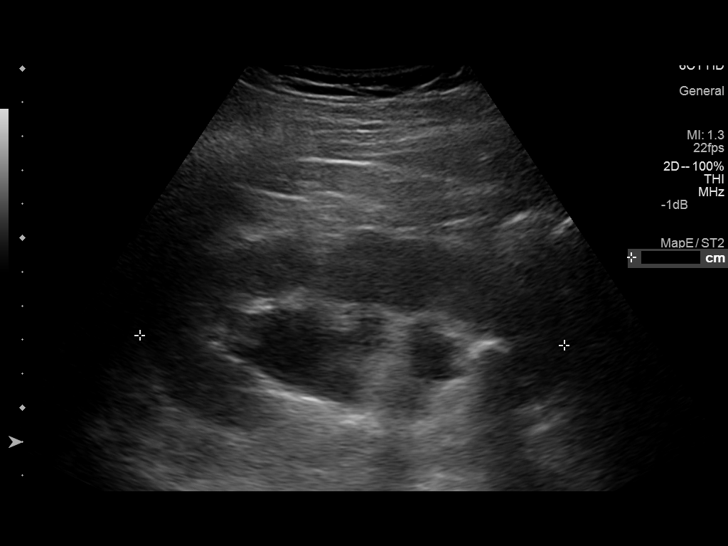
[im 17/27]
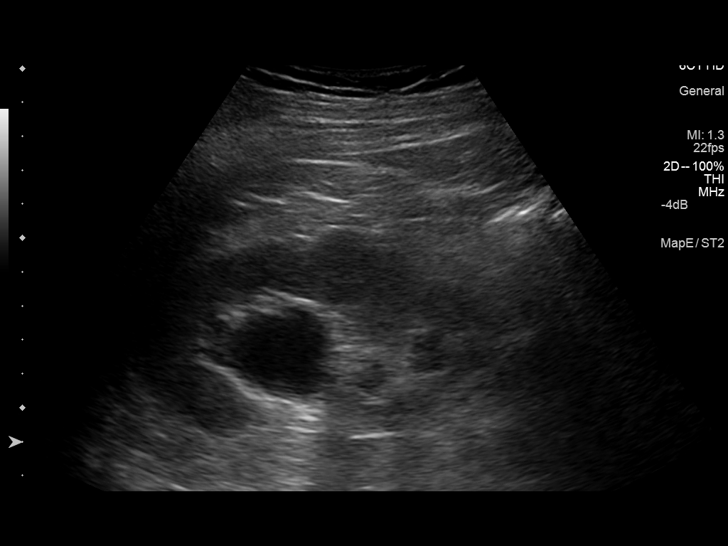
[im 18/27]
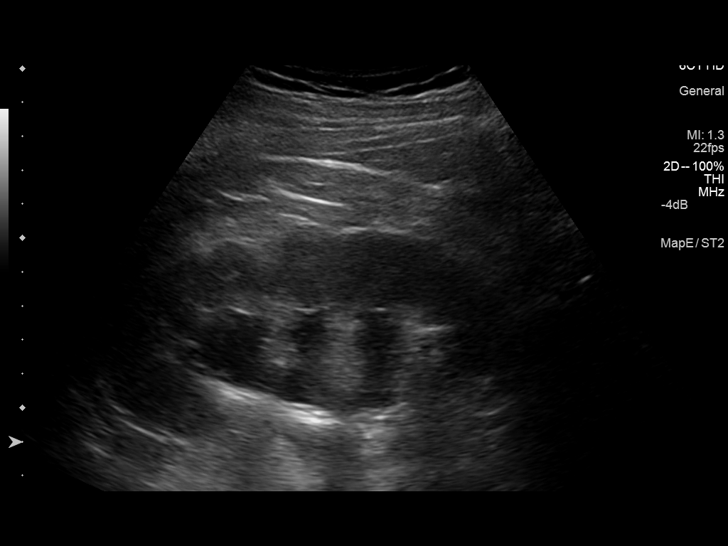
[im 20/27]
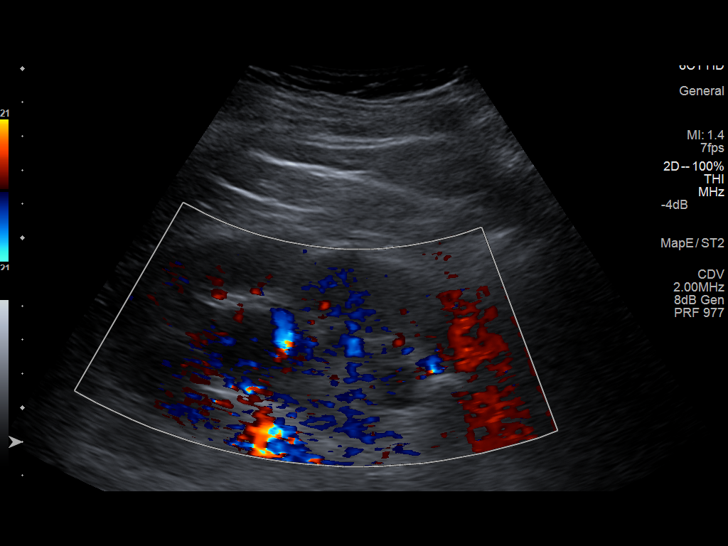
[im 22/27]
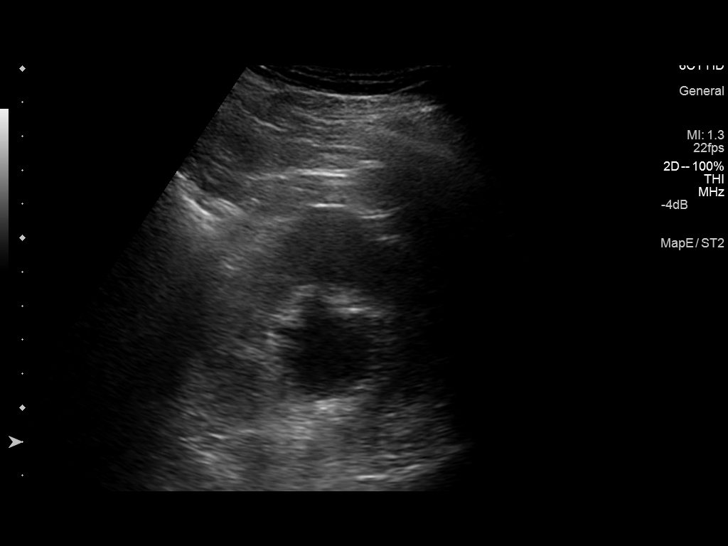
[im 24/27]
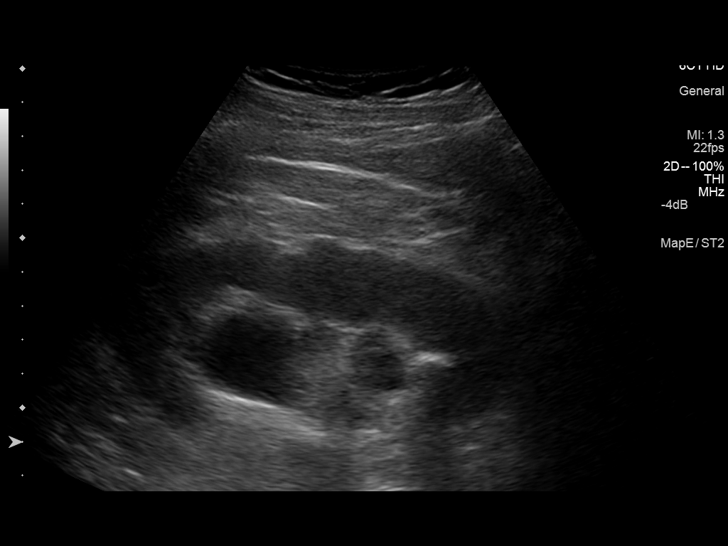
[im 27/27]
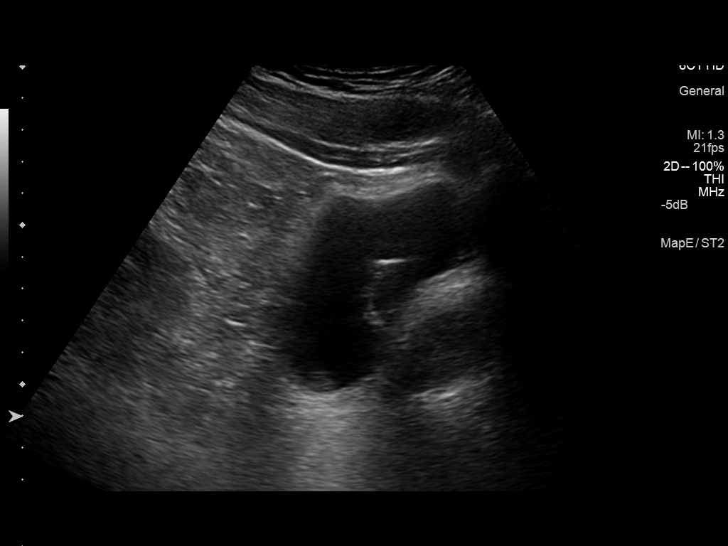

[14 of 25 positions shown; findings below may reference images not displayed]

FINDINGS: Right Kidney:

Length: 11.6 cm. Echogenicity within normal limits. No mass or
hydronephrosis visualized.

Left Kidney:

Length: 12.5 cm. There is unchanged moderate hydronephrosis. An 11
mm (by CT) stone is noted in the lower pole. A portion of the stent
is visible at the level of the renal pelvis. No mass or cystic
collection.

Bladder:

Unremarkable bladder wall. Ureteral stent from the left with fully
formed lower loop.
IMPRESSION: 1. Unchanged moderate left hydronephrosis.
2. No appreciable displacement of a left ureteral stent.
3. Left nephrolithiasis.
